# Patient Record
Sex: Female | Born: 1990 | Race: Black or African American | Hispanic: No | Marital: Single | Smoking: Current every day smoker
Health system: Southern US, Community
[De-identification: ages and names within clinical notes are randomized; demographics above are authoritative.]

---

## 2017-11-05 ENCOUNTER — Encounter (HOSPITAL_BASED_OUTPATIENT_CLINIC_OR_DEPARTMENT_OTHER): Payer: Self-pay | Admitting: *Deleted

## 2017-11-05 ENCOUNTER — Emergency Department (HOSPITAL_BASED_OUTPATIENT_CLINIC_OR_DEPARTMENT_OTHER)
Admission: EM | Admit: 2017-11-05 | Discharge: 2017-11-06 | Disposition: A | Discharge status: Home / Self Care | Payer: Managed Care, Other (non HMO) | Attending: Emergency Medicine | Admitting: Emergency Medicine

## 2017-11-05 ENCOUNTER — Other Ambulatory Visit: Payer: Self-pay

## 2017-11-05 DIAGNOSIS — J111 Influenza due to unidentified influenza virus with other respiratory manifestations: Secondary | ICD-10-CM | POA: Diagnosis not present

## 2017-11-05 DIAGNOSIS — R52 Pain, unspecified: Secondary | ICD-10-CM | POA: Diagnosis present

## 2017-11-05 DIAGNOSIS — R69 Illness, unspecified: Secondary | ICD-10-CM

## 2017-11-05 DIAGNOSIS — F17210 Nicotine dependence, cigarettes, uncomplicated: Secondary | ICD-10-CM | POA: Diagnosis not present

## 2017-11-05 NOTE — ED Triage Notes (Signed)
Pt reports fever, body aches, cough, emesis x 1 since this afternoon. She took a dose of theraflu but vomited afterward

## 2017-11-06 ENCOUNTER — Emergency Department (HOSPITAL_BASED_OUTPATIENT_CLINIC_OR_DEPARTMENT_OTHER): Payer: Managed Care, Other (non HMO)

## 2017-11-06 LAB — CBC WITH DIFFERENTIAL/PLATELET
Abs Immature Granulocytes: 0.01 10*3/uL (ref 0.00–0.07)
Basophils Absolute: 0 10*3/uL (ref 0.0–0.1)
Basophils Relative: 0 %
EOS PCT: 1 %
Eosinophils Absolute: 0.1 10*3/uL (ref 0.0–0.5)
HEMATOCRIT: 38.6 % (ref 36.0–46.0)
HEMOGLOBIN: 12.3 g/dL (ref 12.0–15.0)
Immature Granulocytes: 0 %
LYMPHS ABS: 1.6 10*3/uL (ref 0.7–4.0)
LYMPHS PCT: 35 %
MCH: 27.2 pg (ref 26.0–34.0)
MCHC: 31.9 g/dL (ref 30.0–36.0)
MCV: 85.2 fL (ref 80.0–100.0)
Monocytes Absolute: 0.5 10*3/uL (ref 0.1–1.0)
Monocytes Relative: 12 %
Neutro Abs: 2.4 10*3/uL (ref 1.7–7.7)
Neutrophils Relative %: 52 %
Platelets: 199 10*3/uL (ref 150–400)
RBC: 4.53 MIL/uL (ref 3.87–5.11)
RDW: 13.9 % (ref 11.5–15.5)
WBC: 4.7 10*3/uL (ref 4.0–10.5)
nRBC: 0 % (ref 0.0–0.2)

## 2017-11-06 LAB — URINALYSIS, ROUTINE W REFLEX MICROSCOPIC
Bilirubin Urine: NEGATIVE
GLUCOSE, UA: NEGATIVE mg/dL
Hgb urine dipstick: NEGATIVE
KETONES UR: NEGATIVE mg/dL
LEUKOCYTES UA: NEGATIVE
Nitrite: NEGATIVE
PH: 7 (ref 5.0–8.0)
Protein, ur: NEGATIVE mg/dL
SPECIFIC GRAVITY, URINE: 1.02 (ref 1.005–1.030)

## 2017-11-06 LAB — INFLUENZA PANEL BY PCR (TYPE A & B)
Influenza A By PCR: POSITIVE — AB
Influenza B By PCR: NEGATIVE

## 2017-11-06 LAB — BASIC METABOLIC PANEL
Anion gap: 8 (ref 5–15)
BUN: 13 mg/dL (ref 6–20)
CALCIUM: 9.1 mg/dL (ref 8.9–10.3)
CHLORIDE: 104 mmol/L (ref 98–111)
CO2: 25 mmol/L (ref 22–32)
Creatinine, Ser: 0.79 mg/dL (ref 0.44–1.00)
GFR calc Af Amer: >60 mL/min (ref >60)
GFR calc non Af Amer: >60 mL/min (ref >60)
GLUCOSE: 94 mg/dL (ref 70–99)
Potassium: 4 mmol/L (ref 3.5–5.1)
Sodium: 137 mmol/L (ref 135–145)

## 2017-11-06 MED ORDER — ONDANSETRON HCL 4 MG/2ML IJ SOLN
4.0000 mg | Freq: Once | INTRAMUSCULAR | Status: AC
  Administered 2017-11-06: 4 mg via INTRAVENOUS
  Filled 2017-11-06: qty 2

## 2017-11-06 MED ORDER — KETOROLAC TROMETHAMINE 30 MG/ML IJ SOLN
30.0000 mg | Freq: Once | INTRAMUSCULAR | Status: AC
  Administered 2017-11-06: 30 mg via INTRAVENOUS
  Filled 2017-11-06: qty 1

## 2017-11-06 MED ORDER — BENZONATATE 100 MG PO CAPS: 100.0000 mg | ORAL_CAPSULE | Freq: Three times a day (TID) | ORAL | 0 refills | Status: AC | PRN

## 2017-11-06 MED ORDER — ONDANSETRON HCL 4 MG PO TABS: 4.0000 mg | ORAL_TABLET | Freq: Four times a day (QID) | ORAL | 0 refills | Status: AC

## 2017-11-06 MED ORDER — OSELTAMIVIR PHOSPHATE 75 MG PO CAPS: 75.0000 mg | ORAL_CAPSULE | Freq: Two times a day (BID) | ORAL | 0 refills | Status: AC

## 2017-11-06 MED ORDER — SODIUM CHLORIDE 0.9 % IV BOLUS
1000.0000 mL | Freq: Once | INTRAVENOUS | Status: AC
  Administered 2017-11-06: 1000 mL via INTRAVENOUS

## 2017-11-06 NOTE — ED Provider Notes (Signed)
[facility] EMERGENCY DEPARTMENT Provider Note   CSN: [id number] Arrival date & time: 11/05/17  2250     History   Chief Complaint Chief Complaint  Patient presents with  . Generalized Body Aches    HPI Sheryl Soto is a 27 y.o. female.  Presents with flulike illness that began earlier today.  She reports that she has had headache, malaise, body aches, cough and chest congestion.  She developed nausea earlier today and had vomiting one time.  No diarrhea.  She is not experiencing any specific abdominal pain.     History reviewed. No pertinent past medical history.  There are no active problems to display for this patient.   Past Surgical History:  Procedure Laterality Date  . CESAREAN SECTION       OB History   None      Home Medications    Prior to Admission medications   Medication Sig Start Date End Date Taking? Authorizing Provider  benzonatate (TESSALON) 100 MG capsule Take 1 capsule (100 mg total) by mouth 3 (three) times daily as needed for cough. 11/06/17   Gilda Crease, MD  ondansetron (ZOFRAN) 4 MG tablet Take 1 tablet (4 mg total) by mouth every 6 (six) hours. 11/06/17   Gilda Crease, MD  oseltamivir (TAMIFLU) 75 MG capsule Take 1 capsule (75 mg total) by mouth every 12 (twelve) hours. 11/06/17   Gilda Crease, MD    Family History No family history on file.  Social History Social History   Tobacco Use  . Smoking status: Current Every Day Smoker    Types: Cigarettes  . Smokeless tobacco: Never Used  Substance Use Topics  . Alcohol use: Yes    Comment: occ  . Drug use: Never     Allergies   Patient has no known allergies.   Review of Systems Review of Systems  Constitutional: Positive for chills and fatigue.  Respiratory: Positive for cough.   Gastrointestinal: Positive for nausea and vomiting.  Musculoskeletal: Positive for myalgias.  All other systems reviewed and are  negative.    Physical Exam Updated Vital Signs BP 128/81 (BP Location: Right Arm)   Pulse 91   Temp 99.2 F (37.3 C) (Oral)   Resp 16   Ht 5\' 4"  (1.626 m)   Wt 99.2 kg   LMP 10/17/2017   SpO2 100%   BMI 37.54 kg/m   Physical Exam  Constitutional: She is oriented to person, place, and time. She appears well-developed and well-nourished. No distress.  HENT:  Head: Normocephalic and atraumatic.  Right Ear: Hearing normal.  Left Ear: Hearing normal.  Nose: Nose normal.  Mouth/Throat: Oropharynx is clear and moist and mucous membranes are normal.  Eyes: Pupils are equal, round, and reactive to light. Conjunctivae and EOM are normal.  Neck: Normal range of motion. Neck supple. No muscular tenderness present. No neck rigidity. No Brudzinski's sign and no Kernig's sign noted.  Cardiovascular: Regular rhythm, S1 normal and S2 normal. Exam reveals no gallop and no friction rub.  No murmur heard. Pulmonary/Chest: Effort normal and breath sounds normal. No respiratory distress. She exhibits no tenderness.  Abdominal: Soft. Normal appearance and bowel sounds are normal. There is no hepatosplenomegaly. There is no tenderness. There is no rebound, no guarding, no tenderness at McBurney's point and negative Murphy's sign. No hernia.  Musculoskeletal: Normal range of motion.  Neurological: She is alert and oriented to person, place, and time. She has normal strength. No cranial nerve deficit  or sensory deficit. Coordination normal. GCS eye subscore is 4. GCS verbal subscore is 5. GCS motor subscore is 6.  Skin: Skin is warm, dry and intact. No rash noted. No cyanosis.  Psychiatric: She has a normal mood and affect. Her speech is normal and behavior is normal. Thought content normal.  Nursing note and vitals reviewed.    ED Treatments / Results  Labs (all labs ordered are listed, but only abnormal results are displayed) Labs Reviewed  URINALYSIS, ROUTINE W REFLEX MICROSCOPIC - Abnormal;  Notable for the following components:      Result Value   APPearance CLOUDY (*)    All other components within normal limits  CBC WITH DIFFERENTIAL/PLATELET  BASIC METABOLIC PANEL  INFLUENZA PANEL BY PCR (TYPE A & B)    EKG None  Radiology Dg Chest 2 View  Result Date: 11/06/2017 CLINICAL DATA:  Cough.  Fever. EXAM: CHEST - 2 VIEW COMPARISON:  None. FINDINGS: Moderate bronchial thickening.The cardiomediastinal contours are normal. Pulmonary vasculature is normal. No consolidation, pleural effusion, or pneumothorax. No acute osseous abnormalities are seen. IMPRESSION: Moderate bronchial thickening without pneumonia. Electronically Signed   By: Narda Rutherford M.D.   On: 11/06/2017 01:40    Procedures Procedures (including critical care time)  Medications Ordered in ED Medications  sodium chloride 0.9 % bolus 1,000 mL ( Intravenous Stopped 11/06/17 0227)  ondansetron (ZOFRAN) injection 4 mg (4 mg Intravenous Given 11/06/17 0117)  ketorolac (TORADOL) 30 MG/ML injection 30 mg (30 mg Intravenous Given 11/06/17 0120)     Initial Impression / Assessment and Plan / ED Course  I have reviewed the triage vital signs and the nursing notes.  Pertinent labs & imaging results that were available during my care of the patient were reviewed by me and considered in my medical decision making (see chart for details).     Patient presents with flulike illness.  She does have cough and congestion, normal oxygenation.  Chest x-ray does not show any evidence of pneumonia.  Vital signs remained stable.  She did have a borderline low fever after taking TheraFlu before coming to the ER.  Blood work, however, is reassuring.  White blood cell count is 4.7.  Abdominal exam is benign.  Final Clinical Impressions(s) / ED Diagnoses   Final diagnoses:  Influenza-like illness    ED Discharge Orders         Ordered    ondansetron (ZOFRAN) 4 MG tablet  Every 6 hours     11/06/17 0323    oseltamivir  (TAMIFLU) 75 MG capsule  Every 12 hours     11/06/17 0323    benzonatate (TESSALON) 100 MG capsule  3 times daily PRN     11/06/17 0323           Gilda Crease, MD 11/06/17 [phone]

## 2017-11-06 NOTE — ED Notes (Signed)
Patient transported to X-ray 

## 2018-08-17 DEATH — deceased

## 2019-10-21 IMAGING — DX DG CHEST 2V
2 series · 2 of 2 positions shown · non-contrast
Comparison: None.

CLINICAL DATA: Cough.  Fever.

EXAM:
CHEST - 2 VIEW

[chest pa]
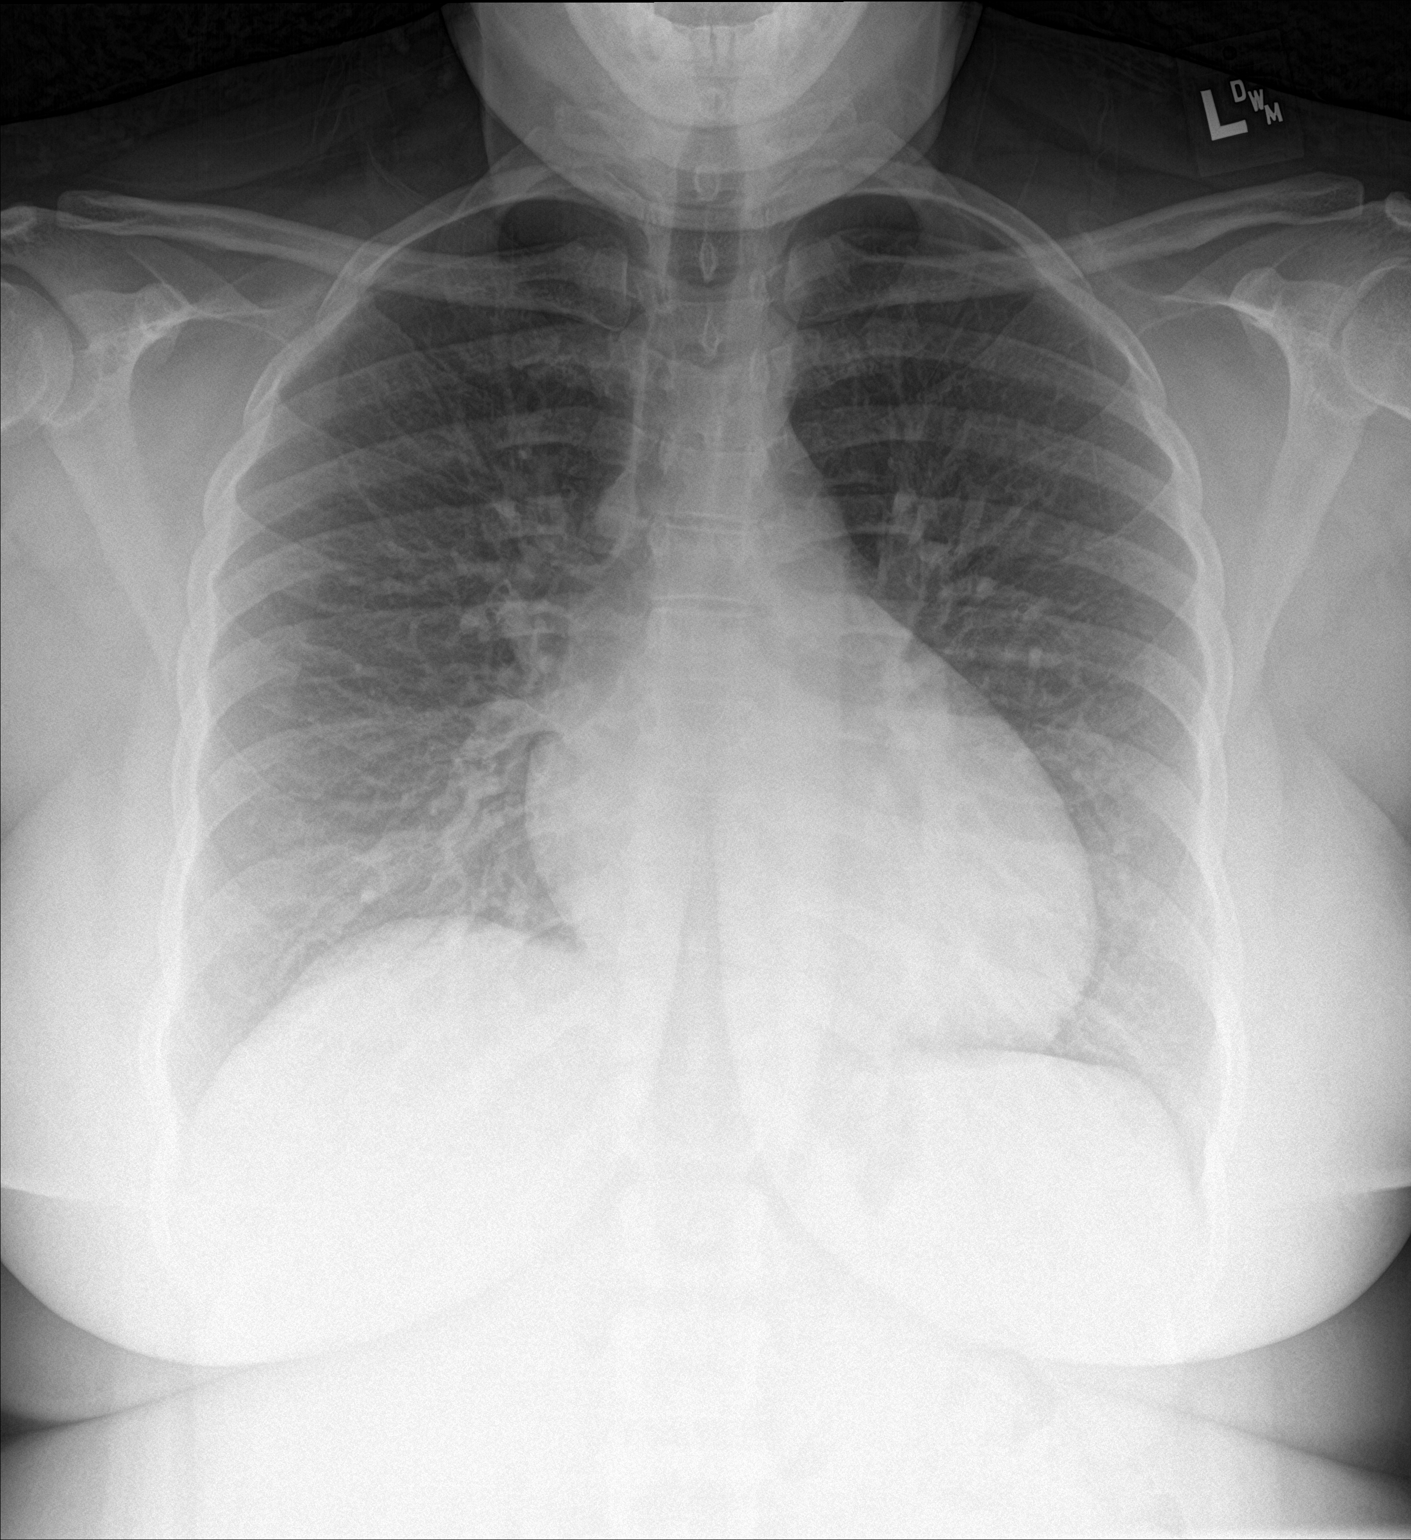

[chest lat]
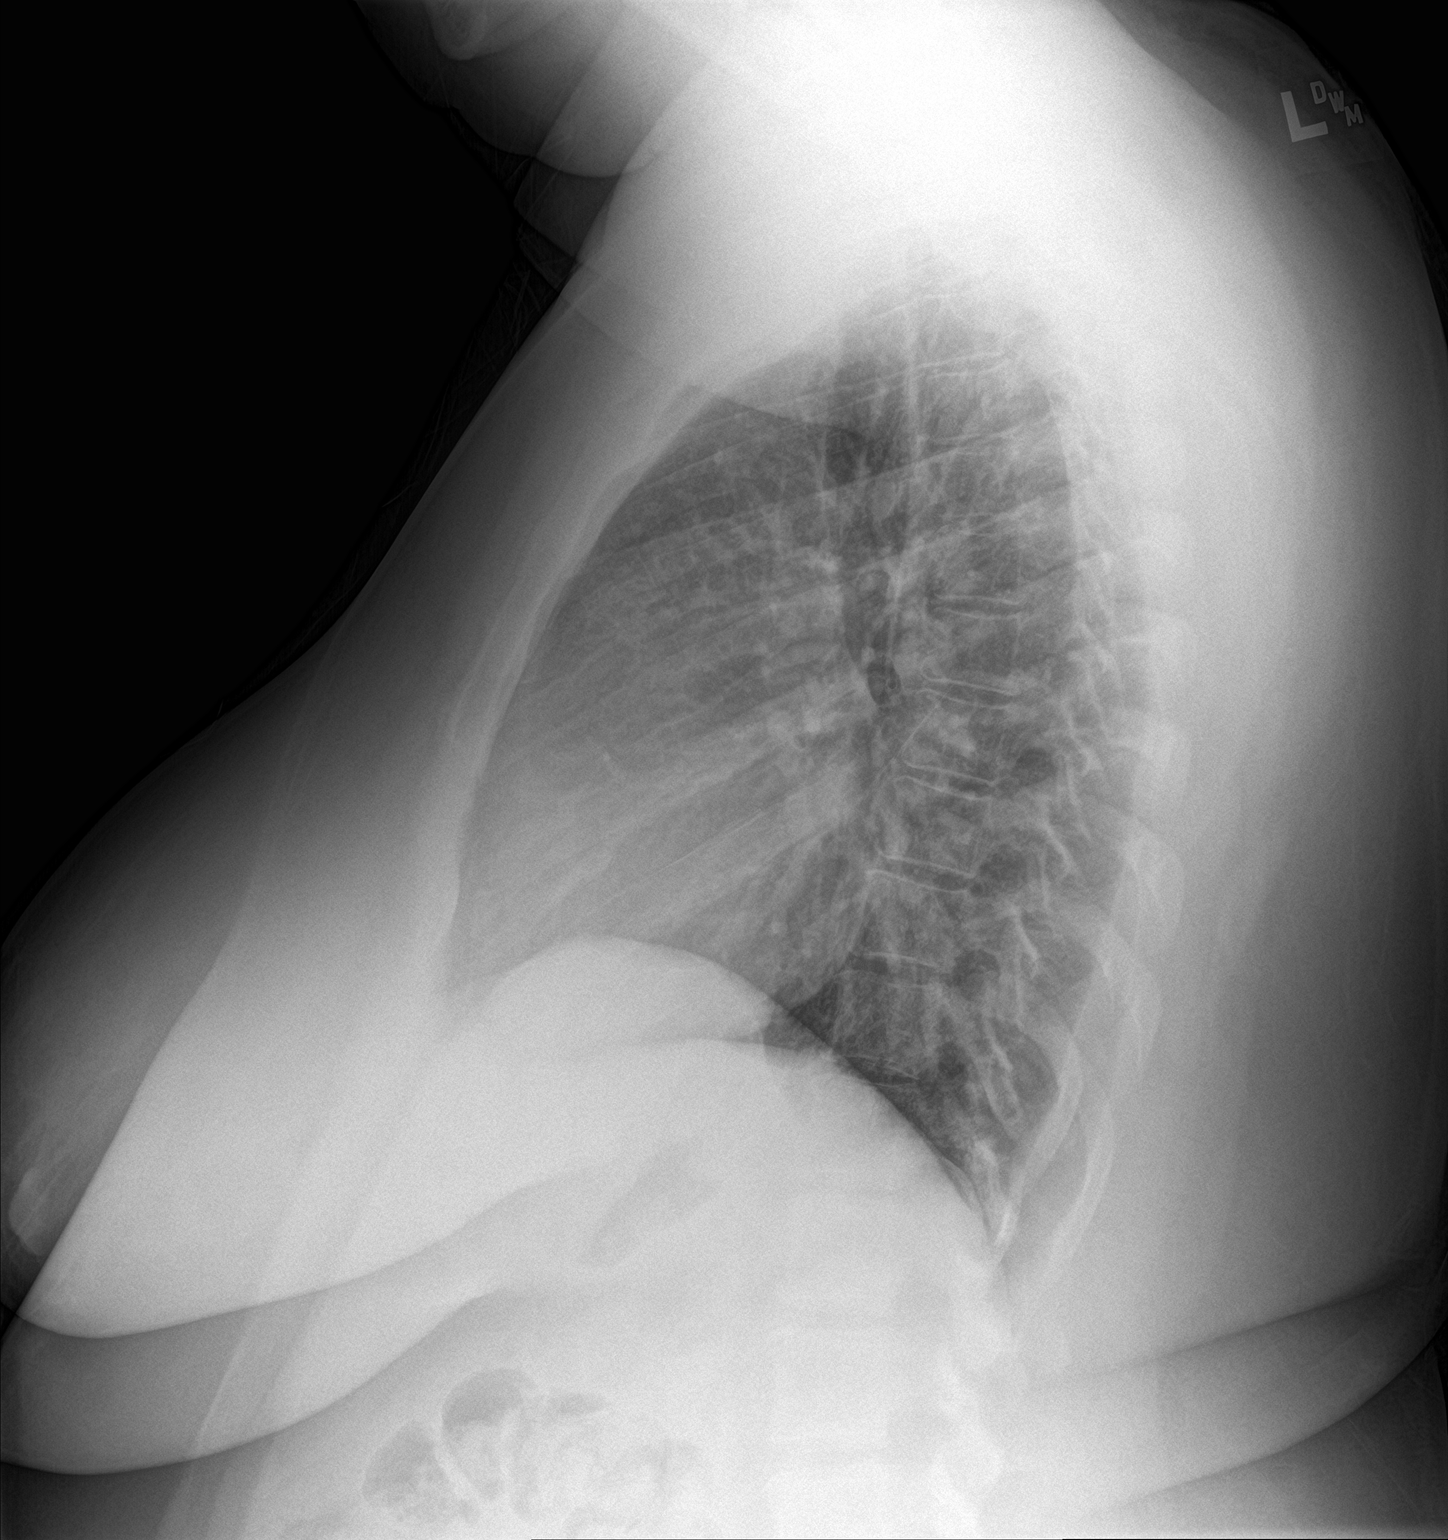

[2 of 2 positions shown; findings below may reference images not displayed]

FINDINGS: Moderate bronchial thickening.The cardiomediastinal contours are
normal. Pulmonary vasculature is normal. No consolidation, pleural
effusion, or pneumothorax. No acute osseous abnormalities are seen.
IMPRESSION: Moderate bronchial thickening without pneumonia.
